# Patient Record
Sex: Male | Born: 2000 | Hispanic: No | Marital: Single | Smoking: Never smoker
Health system: Southern US, Community
[De-identification: ages and names within clinical notes are randomized; demographics above are authoritative.]

## PROBLEM LIST (undated history)

## (undated) DIAGNOSIS — Z8719 Personal history of other diseases of the digestive system: Secondary | ICD-10-CM

## (undated) DIAGNOSIS — T7840XA Allergy, unspecified, initial encounter: Secondary | ICD-10-CM

## (undated) DIAGNOSIS — H7290 Unspecified perforation of tympanic membrane, unspecified ear: Secondary | ICD-10-CM

## (undated) DIAGNOSIS — L309 Dermatitis, unspecified: Secondary | ICD-10-CM

## (undated) DIAGNOSIS — J45909 Unspecified asthma, uncomplicated: Secondary | ICD-10-CM

## (undated) DIAGNOSIS — T4145XA Adverse effect of unspecified anesthetic, initial encounter: Secondary | ICD-10-CM

## (undated) DIAGNOSIS — T8859XA Other complications of anesthesia, initial encounter: Secondary | ICD-10-CM

## (undated) DIAGNOSIS — R05 Cough: Secondary | ICD-10-CM

## (undated) DIAGNOSIS — R0989 Other specified symptoms and signs involving the circulatory and respiratory systems: Secondary | ICD-10-CM

## (undated) HISTORY — PX: TYMPANOPLASTY: SHX33

## (undated) HISTORY — DX: Unspecified asthma, uncomplicated: J45.909

## (undated) HISTORY — DX: Allergy, unspecified, initial encounter: T78.40XA

## (undated) HISTORY — PX: ADENOIDECTOMY: SHX5191

---
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
## 2018-01-19 ENCOUNTER — Encounter: Payer: Self-pay | Admitting: Sports Medicine

## 2018-01-19 ENCOUNTER — Telehealth: Payer: Self-pay

## 2018-01-19 ENCOUNTER — Ambulatory Visit (INDEPENDENT_AMBULATORY_CARE_PROVIDER_SITE_OTHER): Payer: 59 | Admitting: Sports Medicine

## 2018-01-19 DIAGNOSIS — Z8669 Personal history of other diseases of the nervous system and sense organs: Secondary | ICD-10-CM

## 2018-01-19 DIAGNOSIS — Z0000 Encounter for general adult medical examination without abnormal findings: Secondary | ICD-10-CM | POA: Diagnosis not present

## 2018-01-19 LAB — CBC
HCT: 44.7 % (ref 36.0–49.0)
Hemoglobin: 14.7 g/dL (ref 12.0–16.9)
MCH: 25.1 pg (ref 25.0–35.0)
MCHC: 32.9 g/dL (ref 31.0–36.0)
MCV: 76.4 fL — ABNORMAL LOW (ref 78.0–98.0)
MPV: 9 fL (ref 7.5–12.5)
Platelets: 373 Thousand/uL (ref 140–400)
RBC: 5.85 10*6/uL — ABNORMAL HIGH (ref 4.10–5.70)
RDW: 13.8 % (ref 11.0–15.0)
WBC: 6.3 10*3/uL (ref 4.5–13.0)

## 2018-01-19 LAB — COMPREHENSIVE METABOLIC PANEL
AG Ratio: 1.6 (calc) (ref 1.0–2.5)
Albumin: 4.6 g/dL (ref 3.6–5.1)
CO2: 30 mmol/L (ref 20–32)
Calcium: 9.8 mg/dL (ref 8.9–10.4)
Globulin: 2.9 g/dL (calc) (ref 2.1–3.5)
Glucose, Bld: 80 mg/dL (ref 65–99)
Potassium: 4.5 mmol/L (ref 3.8–5.1)
Sodium: 139 mmol/L (ref 135–146)
Total Bilirubin: 0.4 mg/dL (ref 0.2–1.1)
Total Protein: 7.5 g/dL (ref 6.3–8.2)

## 2018-01-19 LAB — COMPREHENSIVE METABOLIC PANEL WITH GFR
ALT: 20 U/L (ref 8–46)
AST: 20 U/L (ref 12–32)
Alkaline phosphatase (APISO): 112 U/L (ref 48–230)
BUN: 13 mg/dL (ref 7–20)
Chloride: 104 mmol/L (ref 98–110)
Creat: 0.89 mg/dL (ref 0.60–1.26)

## 2018-01-19 LAB — LIPID PANEL W/REFLEX DIRECT LDL
Cholesterol: 130 mg/dL (ref <170)
HDL: 41 mg/dL — ABNORMAL LOW (ref >45)
LDL Cholesterol (Calc): 73 mg/dL (ref <110)
Non-HDL Cholesterol (Calc): 89 mg/dL (calc) (ref <120)
Total CHOL/HDL Ratio: 3.2 (calc) (ref <5.0)
Triglycerides: 79 mg/dL (ref <90)

## 2018-01-19 LAB — TSH: TSH: 1.44 mIU/L (ref 0.50–4.30)

## 2018-01-19 MED ORDER — TOPIRAMATE 50 MG PO TABS: ORAL_TABLET | ORAL | 3 refills | Status: DC

## 2018-01-19 MED ORDER — TOPIRAMATE ER 25 MG PO CAP24: 1.0000 | ORAL_CAPSULE | Freq: Every day | ORAL | 3 refills | Status: DC

## 2018-01-19 NOTE — Assessment & Plan Note (Signed)
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 These occur every day, as this is an uncontrolled migraine syndrome we are going to add Trokendi. He can use over-the-counter NSAIDs and migraine medications for abortive treatment, they are not debilitating. Return to see me in 1 month, he will do a migraine diary we will augment the dose as needed.

## 2018-01-19 NOTE — Telephone Encounter (Signed)
The pharmacist states he didn't run the card because the insurance requires a step therapy and the coupon card is not indefinite. I told him to run it as secondary and he states Dr Yehuda Buddhekkendam just needs to prescribe the regular topiramate twice daily for a month and then the insurance will pay for it.

## 2018-01-19 NOTE — Progress Notes (Signed)
Subjective:    CC: headaches  HPI: Derek Lam,  a 17yo male , is presenting today with a cc of headaches that have occurred in increased frequency and intenstiy since January. Pt reports that headaches are occurring 1-2 times a week. Pt endorses that the pain is pounding in quality and that he is able to alleviate the pain with otc ibuprofen.  Pt reports that the headaches last anywhere from a few minutes to several hours when he does not attempt to treat with otc ibuprofen.  Pt reports that the pounding headache pain is centered in his frontal region and at its worse will radiate through the parietal region and into the occipital region. Pt denies associated symptoms of lacrimation, ringing in the ears or changes in hearing, and/or changes in vision.  Pt denies n/v.  Pt denies accompanying  Photophobia/phonobia.  Pt also reports a frontal paresthesia that has been occurring more frequently since January.  Pt reports that tingling occurs multiple times a day, every day (when tired, at night before bed, and during the school day when feeling energized).  Pt reports that the tingling sensation does not precede the aforementioned headaches. Pt also reports that the tingling sensation only last a few seconds to a minute.    Pt also denies that the tingling and the headache are exacerbated by noise or light.  Pt reports that the paresthesia is moderately disruptive as it interrupts his ability to concentrate.    I reviewed the past medical history, family history, social history, surgical history, and allergies today and no changes were needed.  Please see the problem list section below in epic for further details.  Past Medical History: Past Medical History:  Diagnosis Date  . Allergy   . Anesthesia complication    post-op nausea  . Asthma    daily and prn inhalers  . Cough 08/19/2012  . Eczema    wrists and inner arms  . History of gastroesophageal reflux (GERD)    as a child - now resolved    . Runny nose 08/19/2012   clear drainage  . 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 ADENOIDECTOMY    . TYMPANOPLASTY     left  . TYMPANOPLASTY  08/24/2012   Procedure: TYMPANOPLASTY;  Surgeon: Drema Halon, MD;  Location: [facility];  Service: ENT;  Laterality: Left;  . TYMPANOSTOMY TUBE PLACEMENT  2002   Social History: Social History   Socioeconomic History  . Marital status: Single    Spouse name: Not on file  . Number of children: Not on file  . Years of education: Not on file  . Highest education level: Not on file  Occupational History  . Not on file  Social Needs  . Financial resource strain: Not on file  . Food insecurity:    Worry: Not on file    Inability: Not on file  . Transportation needs:    Medical: Not on file    Non-medical: Not on file  Tobacco Use  . Smoking status: Never Smoker  . Smokeless tobacco: Never Used  Substance and Sexual Activity  . Alcohol use: No  . Drug use: No  . Sexual activity: Not on file  Lifestyle  . Physical activity:    Days per week: Not on file    Minutes per session: Not on file  . Stress: Not on file  Relationships  .  Social connections:    Talks on phone: Not on file    Gets together: Not on file    Attends religious service: Not on file    Active member of club or organization: Not on file    Attends meetings of clubs or organizations: Not on file    Relationship status: Not on file  Other Topics Concern  . Not on file  Social History Narrative  . Not on file   Family History: Family History  Problem Relation Age of Onset  . Hypertension Mother   . Anesthesia problems Mother        post-op nausea  . Heart disease Paternal Grandfather        MI  . Asthma Maternal Aunt   . Diabetes Maternal Aunt   . Diabetes Paternal Aunt   . Hypertension Paternal Aunt   . Asthma Paternal Aunt   . Diabetes  Maternal Grandmother   . Asthma Maternal Grandmother   . Hypertension Paternal Grandmother   . Heart disease Paternal Grandmother    Pt reports that both mother and father suffer from migraine headaches.   Allergies: No Known Allergies Medications: See med rec.  Review of Systems: No fevers, chills, night sweats, weight loss, chest pain, or shortness of breath.   Objective:    General: Well Developed, well nourished, and in no acute distress.  Neuro: Alert and oriented x3, extra-ocular muscles intact, sensation grossly intact. No Neurological deficits CN I-XII HEENT: Normocephalic, atraumatic, pupils equal round reactive to light, neck supple, no masses, no lymphadenopathy, thyroid nonpalpable.  Skin: Warm and dry, no rashes. Cardiac: Regular rate and rhythm, no murmurs rubs or gallops, no lower extremity edema.  Respiratory: Clear to auscultation bilaterally. Not using accessory muscles, speaking in full sentences.  Impression and Recommendations:    Migraine/Acelphalgic Migraine  Pt will continue to abort the pulsatile pain of headaches with otc ibuprofen  Pt is  prescribed low-dose long acting migraine medication (topiramate) to prevent the onset of headaches and acelphalgia while ideally minimizing potential side effects of peripheral numbness and difficulty with memory.   Pt is also advised to keep track of the frequency of his paresthesia and headaches for the next month. Pt is advised to follow up and was educated about the potential need to adjust the dosage if there is no improvement.    ___________________________________________ Ihor Austinhomas J. Benjamin Stainhekkekandam, M.D., ABFM., CAQSM. Primary Care and Sports Medicine [facility]  Adjunct Instructor of Family Medicine  [employer]

## 2018-01-19 NOTE — Telephone Encounter (Signed)
The pharmacist needs to run it with the discount coupon as secondary insurance and that should make it covered whether or not the primary insurer covers it. And should be free.

## 2018-01-19 NOTE — Telephone Encounter (Signed)
Misty called and states the Trokendi is not covered until he has tried and failed regular topiramate. Please advise.

## 2018-01-19 NOTE — Telephone Encounter (Signed)
Okay, sent in regular topiramate.  I would like him to start with a half tab at bedtime for a week then just 1 tab at bedtime rather than twice a day.  Please let his mother know that he is probably going to get some tingly sensations first.

## 2018-01-19 NOTE — Patient Instructions (Signed)
Migraine Headache A migraine headache is an intense, throbbing pain on one side or both sides of the head. Migraines may also cause other symptoms, such as nausea, vomiting, and sensitivity to light and noise. What are the causes? Doing or taking certain things may also trigger migraines, such as:  Alcohol.  Smoking.  Medicines, such as: ? Medicine used to treat chest pain (nitroglycerine). ? Birth control pills. ? Estrogen pills. ? Certain blood pressure medicines.  Aged cheeses, chocolate, or caffeine.  Foods or drinks that contain nitrates, glutamate, aspartame, or tyramine.  Physical activity.  Other things that may trigger a migraine include:  Menstruation.  Pregnancy.  Hunger.  Stress, lack of sleep, too much sleep, or fatigue.  Weather changes.  What increases the risk? The following factors may make you more likely to experience migraine headaches:  Age. Risk increases with age.  Family history of migraine headaches.  Being Caucasian.  Depression and anxiety.  Obesity.  Being a woman.  Having a hole in the heart (patent foramen ovale) or other heart problems.  What are the signs or symptoms? The main symptom of this condition is pulsating or throbbing pain. Pain may:  Happen in any area of the head, such as on one side or both sides.  Interfere with daily activities.  Get worse with physical activity.  Get worse with exposure to bright lights or loud noises.  Other symptoms may include:  Nausea.  Vomiting.  Dizziness.  General sensitivity to bright lights, loud noises, or smells.  Before you get a migraine, you may get warning signs that a migraine is developing (aura). An aura may include:  Seeing flashing lights or having blind spots.  Seeing bright spots, halos, or zigzag lines.  Having tunnel vision or blurred vision.  Having numbness or a tingling feeling.  Having trouble talking.  Having muscle weakness.  How is this  diagnosed? A migraine headache can be diagnosed based on:  Your symptoms.  A physical exam.  Tests, such as CT scan or MRI of the head. These imaging tests can help rule out other causes of headaches.  Taking fluid from the spine (lumbar puncture) and analyzing it (cerebrospinal fluid analysis, or CSF analysis).  How is this treated? A migraine headache is usually treated with medicines that:  Relieve pain.  Relieve nausea.  Prevent migraines from coming back.  Treatment may also include:  Acupuncture.  Lifestyle changes like avoiding foods that trigger migraines.  Follow these instructions at home: Medicines  Take over-the-counter and prescription medicines only as told by your health care provider.  Do not drive or use heavy machinery while taking prescription pain medicine.  To prevent or treat constipation while you are taking prescription pain medicine, your health care provider may recommend that you: ? Drink enough fluid to keep your urine clear or pale yellow. ? Take over-the-counter or prescription medicines. ? Eat foods that are high in fiber, such as fresh fruits and vegetables, whole grains, and beans. ? Limit foods that are high in fat and processed sugars, such as fried and sweet foods. Lifestyle  Avoid alcohol use.  Do not use any products that contain nicotine or tobacco, such as cigarettes and e-cigarettes. If you need help quitting, ask your health care provider.  Get at least 8 hours of sleep every night.  Limit your stress. General instructions   Keep a journal to find out what may trigger your migraine headaches. For example, write down: ? What you eat and   drink. ? How much sleep you get. ? Any change to your diet or medicines.  If you have a migraine: ? Avoid things that make your symptoms worse, such as bright lights. ? It may help to lie down in a dark, quiet room. ? Do not drive or use heavy machinery. ? Ask your health care provider  what activities are safe for you while you are experiencing symptoms.  Keep all follow-up visits as told by your health care provider. This is important. Contact a health care provider if:  You develop symptoms that are different or more severe than your usual migraine symptoms. Get help right away if:  Your migraine becomes severe.  You have a fever.  You have a stiff neck.  You have vision loss.  Your muscles feel weak or like you cannot control them.  You start to lose your balance often.  You develop trouble walking.  You faint. This information is not intended to replace advice given to you by your health care provider. Make sure you discuss any questions you have with your health care provider. Document Released: 09/15/2005 Document Revised: 04/04/2016 Document Reviewed: 03/03/2016 Elsevier Interactive Patient Education  2017 Elsevier Inc.   

## 2018-01-20 ENCOUNTER — Other Ambulatory Visit: Payer: Self-pay | Admitting: Sports Medicine

## 2018-01-20 LAB — HEMOGLOBIN A1C
Hgb A1c MFr Bld: 5.6 % of total Hgb (ref <5.7)
Mean Plasma Glucose: 114 (calc)
eAG (mmol/L): 6.3 (calc)

## 2018-01-20 LAB — HIV ANTIBODY (ROUTINE TESTING W REFLEX): HIV 1&2 Ab, 4th Generation: NONREACTIVE

## 2018-01-20 LAB — VITAMIN D 25 HYDROXY (VIT D DEFICIENCY, FRACTURES): Vit D, 25-Hydroxy: 28 ng/mL — ABNORMAL LOW (ref 30–100)

## 2018-01-20 MED ORDER — VITAMIN D (ERGOCALCIFEROL) 1.25 MG (50000 UNIT) PO CAPS: 50000.0000 [IU] | ORAL_CAPSULE | ORAL | 0 refills | Status: DC

## 2018-01-20 NOTE — Telephone Encounter (Signed)
Patient's mom advised 

## 2018-01-22 ENCOUNTER — Other Ambulatory Visit: Payer: Self-pay

## 2018-01-22 MED ORDER — VITAMIN D (ERGOCALCIFEROL) 1.25 MG (50000 UNIT) PO CAPS: 50000.0000 [IU] | ORAL_CAPSULE | ORAL | 0 refills | Status: DC

## 2018-01-22 NOTE — Telephone Encounter (Signed)
Patient's mom wanted us to [facility] the [facility]. [facility] and [facility].

## 2018-03-02 ENCOUNTER — Ambulatory Visit (INDEPENDENT_AMBULATORY_CARE_PROVIDER_SITE_OTHER): Payer: 59 | Admitting: Family Medicine

## 2018-03-02 VITALS — BP 115/68 | HR 69 | Ht 69.5 in | Wt 169.0 lb

## 2018-03-02 DIAGNOSIS — Z23 Encounter for immunization: Secondary | ICD-10-CM | POA: Diagnosis not present

## 2018-03-02 NOTE — Progress Notes (Signed)
   Subjective:    Patient ID: Derek Lam, male    DOB: 2000/12/18, 17 y.o.   MRN: [id number]  HPI Pt here for meningiccal vaccine for school   Review of Systems     Objective:   Physical Exam        Assessment & Plan:  Pt tolerated injection well without any complications. KG LPN

## 2018-04-29 DIAGNOSIS — H5213 Myopia, bilateral: Secondary | ICD-10-CM | POA: Diagnosis not present

## 2018-09-08 ENCOUNTER — Ambulatory Visit (INDEPENDENT_AMBULATORY_CARE_PROVIDER_SITE_OTHER): Payer: BLUE CROSS/BLUE SHIELD | Admitting: Sports Medicine

## 2018-09-08 ENCOUNTER — Encounter: Payer: Self-pay | Admitting: Sports Medicine

## 2018-09-08 DIAGNOSIS — L02412 Cutaneous abscess of left axilla: Secondary | ICD-10-CM | POA: Insufficient documentation

## 2018-09-08 MED ORDER — DOXYCYCLINE HYCLATE 100 MG PO TABS: 100.0000 mg | ORAL_TABLET | Freq: Two times a day (BID) | ORAL | 0 refills | Status: AC

## 2018-09-08 MED ORDER — HYDROCODONE-ACETAMINOPHEN 5-325 MG PO TABS: 1.0000 | ORAL_TABLET | Freq: Three times a day (TID) | ORAL | 0 refills | Status: DC | PRN

## 2018-09-08 NOTE — Patient Instructions (Signed)
Incision and Drainage, Care After Refer to this sheet in the next few weeks. These instructions provide you with information about caring for yourself after your procedure. Your health care provider may also give you more specific instructions. Your treatment has been planned according to current medical practices, but problems sometimes occur. Call your health care provider if you have any problems or questions after your procedure. What can I expect after the procedure? After the procedure, it is common to have:  Pain or discomfort around your incision site.  Drainage from your incision.  Follow these instructions at home:  Take over-the-counter and prescription medicines only as told by your health care provider.  If you were prescribed an antibiotic medicine, take it as told by your health care provider.Do not stop taking the antibiotic even if you start to feel better.  Followinstructions from your health care provider about: ? How to take care of your incision. ? When and how you should change your packing and bandage (dressing). Wash your hands with soap and water before you change your dressing. If soap and water are not available, use hand sanitizer. ? When you should remove your dressing.  Do not take baths, swim, or use a hot tub until your health care provider approves.  Keep all follow-up visits as told by your health care provider. This is important.  Check your incision area every day for signs of infection. Check for: ? More redness, swelling, or pain. ? More fluid or blood. ? Warmth. ? Pus or a bad smell. Contact a health care provider if:  Your cyst or abscess returns.  You have a fever.  You have more redness, swelling, or pain around your incision.  You have more fluid or blood coming from your incision.  Your incision feels warm to the touch.  You have pus or a bad smell coming from your incision. Get help right away if:  You have severe pain or  bleeding.  You cannot eat or drink without vomiting.  You have decreased urine output.  You become short of breath.  You have chest pain.  You cough up blood.  The area where the incision and drainage occurred becomes numb or it tingles. This information is not intended to replace advice given to you by your health care provider. Make sure you discuss any questions you have with your health care provider. Document Released: 12/08/2011 Document Revised: 02/15/2016 Document Reviewed: 07/06/2015 Elsevier Interactive Patient Education  2018 Elsevier Inc.  

## 2018-09-08 NOTE — Assessment & Plan Note (Signed)
Persistent for 3 weeks. I&D with packing. Hydrocodone for postop pain, doxycycline, wound culture. Return to see me in about 2 weeks. Mother will remove 1-2 inch of packing daily.

## 2018-09-08 NOTE — Progress Notes (Signed)
Subjective:    CC: Armpit mass  HPI: This is a pleasant 17 year old male, for the past 3 weeks he has had a boil under his left axilla, he has been doing warm compresses without any improvement.  Pain is moderate, persistent, localized without radiation, no fever, chills, night sweats.  I reviewed the past medical history, family history, social history, surgical history, and allergies today and no changes were needed.  Please see the problem list section below in epic for further details.  Past Medical History: Past Medical History:  Diagnosis Date  . Allergy   . Anesthesia complication    post-op nausea  . Asthma    daily and prn inhalers  . Cough 08/19/2012  . Eczema    wrists and inner arms  . History of gastroesophageal reflux (GERD)    as a child - now resolved  . Runny nose 08/19/2012   clear drainage  . 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 ADENOIDECTOMY    . TYMPANOPLASTY     left  . TYMPANOPLASTY  08/24/2012   Procedure: TYMPANOPLASTY;  Surgeon: Drema Halonhristopher E Newman, MD;  Location: [facility];  Service: ENT;  Laterality: Left;  . TYMPANOSTOMY TUBE PLACEMENT  2002   Social History: Social History   Socioeconomic History  . Marital status: Single    Spouse name: Not on file  . Number of children: Not on file  . Years of education: Not on file  . Highest education level: Not on file  Occupational History  . Not on file  Social Needs  . Financial resource strain: Not on file  . Food insecurity:    Worry: Not on file    Inability: Not on file  . Transportation needs:    Medical: Not on file    Non-medical: Not on file  Tobacco Use  . Smoking status: Never Smoker  . Smokeless tobacco: Never Used  Substance and Sexual Activity  . Alcohol use: No  . Drug use: No  . Sexual activity: Not on file  Lifestyle  . Physical activity:    Days per week: Not on  file    Minutes per session: Not on file  . Stress: Not on file  Relationships  . Social connections:    Talks on phone: Not on file    Gets together: Not on file    Attends religious service: Not on file    Active member of club or organization: Not on file    Attends meetings of clubs or organizations: Not on file    Relationship status: Not on file  Other Topics Concern  . Not on file  Social History Narrative  . Not on file   Family History: Family History  Problem Relation Age of Onset  . Hypertension Mother   . Anesthesia problems Mother        post-op nausea  . Heart disease Paternal Grandfather        MI  . Asthma Maternal Aunt   . Diabetes Maternal Aunt   . Diabetes Paternal Aunt   . Hypertension Paternal Aunt   . Asthma Paternal Aunt   . Diabetes Maternal Grandmother   . Asthma Maternal Grandmother   . Hypertension Paternal Grandmother   . Heart disease Paternal Grandmother    Allergies: No Known Allergies Medications: See med rec.  Review of Systems: No fevers, chills, night sweats, weight loss, chest pain,  or shortness of breath.   Objective:    General: Well Developed, well nourished, and in no acute distress.  Neuro: Alert and oriented x3, extra-ocular muscles intact, sensation grossly intact.  HEENT: Normocephalic, atraumatic, pupils equal round reactive to light, neck supple, no masses, no lymphadenopathy, thyroid nonpalpable.  Skin: Warm and dry, no rashes. Cardiac: Regular rate and rhythm, no murmurs rubs or gallops, no lower extremity edema.  Respiratory: Clear to auscultation bilaterally. Not using accessory muscles, speaking in full sentences. Left axilla: Abscess draining.  Complicated Incision and drainage of abscess. Risks, benefits, and alternatives explained and consent obtained. Time out conducted. Surface cleaned with alcohol. 10cc lidocaine with epinephine infiltrated around abscess. Adequate anesthesia ensured. Area prepped and  draped in a sterile fashion. #11 blade used to make a stab incision into abscess. Pus expressed with pressure. Curved hemostat used to explore 4 quadrants and loculations broken up. Further purulence expressed. Iodoform packing placed leaving a 1-inch tail. Hemostasis achieved. Pt stable. Aftercare and follow-up advised.  Impression and Recommendations:    Abscess of axilla, left Persistent for 3 weeks. I&D with packing. Hydrocodone for postop pain, doxycycline, wound culture. Return to see me in about 2 weeks. Mother will remove 1-2 inch of packing daily. ___________________________________________ Ihor Austin. Benjamin Stain, M.D., ABFM., CAQSM. Primary Care and Sports Medicine [facility]  Adjunct Professor of Family Medicine  [employer]

## 2018-09-11 LAB — WOUND CULTURE
MICRO NUMBER:: 91484017
SPECIMEN QUALITY:: ADEQUATE

## 2018-09-23 ENCOUNTER — Encounter: Payer: Self-pay | Admitting: Sports Medicine

## 2018-09-23 ENCOUNTER — Ambulatory Visit (INDEPENDENT_AMBULATORY_CARE_PROVIDER_SITE_OTHER): Payer: BLUE CROSS/BLUE SHIELD | Admitting: Sports Medicine

## 2018-09-23 DIAGNOSIS — L02412 Cutaneous abscess of left axilla: Secondary | ICD-10-CM

## 2018-09-23 NOTE — Assessment & Plan Note (Signed)
Doing well after I&D back on the 11th. He will keep it covered for another week or so with a piece of gauze to keep it dry, otherwise return as needed.

## 2018-09-23 NOTE — Progress Notes (Signed)
  Subjective: Status post I&D with packing of the left axillary abscess, doing extremely well.  Objective: General: Well-developed, well-nourished, and in no acute distress. Left axilla: Fullness is gone, mass is gone, no tenderness, drainage.  Healing well.  Assessment/plan:   Abscess of axilla, left Doing well after I&D back on the 11th. He will keep it covered for another week or so with a piece of gauze to keep it dry, otherwise return as needed. ___________________________________________ Ihor Austinhomas J. Benjamin Stainhekkekandam, M.D., ABFM., CAQSM. Primary Care and Sports Medicine [facility]  Adjunct Instructor of Family Medicine  [employer]
